# Patient Record
Sex: Male | Born: 1980
Health system: Southern US, Community
[De-identification: ages and names within clinical notes are randomized; demographics above are authoritative.]

## PROBLEM LIST (undated history)

## (undated) DIAGNOSIS — K219 Gastro-esophageal reflux disease without esophagitis: Secondary | ICD-10-CM

## (undated) DIAGNOSIS — N200 Calculus of kidney: Secondary | ICD-10-CM

## (undated) DIAGNOSIS — I10 Essential (primary) hypertension: Secondary | ICD-10-CM

## (undated) HISTORY — PX: WISDOM TOOTH EXTRACTION: SHX21

## (undated) HISTORY — DX: Calculus of kidney: N20.0

---

## 2001-02-24 ENCOUNTER — Emergency Department (HOSPITAL_COMMUNITY): Admission: EM | Admit: 2001-02-24 | Discharge: 2001-02-24 | Payer: Self-pay | Admitting: Emergency Medicine

## 2016-08-06 ENCOUNTER — Encounter (HOSPITAL_BASED_OUTPATIENT_CLINIC_OR_DEPARTMENT_OTHER): Payer: Self-pay | Admitting: Emergency Medicine

## 2016-08-06 ENCOUNTER — Emergency Department (HOSPITAL_BASED_OUTPATIENT_CLINIC_OR_DEPARTMENT_OTHER)
Admission: EM | Admit: 2016-08-06 | Discharge: 2016-08-07 | Disposition: A | Discharge status: Home / Self Care | Payer: Worker's Compensation | Attending: Emergency Medicine | Admitting: Emergency Medicine

## 2016-08-06 DIAGNOSIS — I10 Essential (primary) hypertension: Secondary | ICD-10-CM | POA: Diagnosis not present

## 2016-08-06 DIAGNOSIS — Y990 Civilian activity done for income or pay: Secondary | ICD-10-CM | POA: Insufficient documentation

## 2016-08-06 DIAGNOSIS — Z79899 Other long term (current) drug therapy: Secondary | ICD-10-CM | POA: Diagnosis not present

## 2016-08-06 DIAGNOSIS — S29011A Strain of muscle and tendon of front wall of thorax, initial encounter: Secondary | ICD-10-CM | POA: Diagnosis not present

## 2016-08-06 DIAGNOSIS — J45909 Unspecified asthma, uncomplicated: Secondary | ICD-10-CM | POA: Insufficient documentation

## 2016-08-06 DIAGNOSIS — Y929 Unspecified place or not applicable: Secondary | ICD-10-CM | POA: Insufficient documentation

## 2016-08-06 DIAGNOSIS — Y9389 Activity, other specified: Secondary | ICD-10-CM | POA: Insufficient documentation

## 2016-08-06 DIAGNOSIS — S299XXA Unspecified injury of thorax, initial encounter: Secondary | ICD-10-CM | POA: Diagnosis present

## 2016-08-06 DIAGNOSIS — X501XXA Overexertion from prolonged static or awkward postures, initial encounter: Secondary | ICD-10-CM | POA: Insufficient documentation

## 2016-08-06 HISTORY — DX: Essential (primary) hypertension: I10

## 2016-08-06 HISTORY — DX: Gastro-esophageal reflux disease without esophagitis: K21.9

## 2016-08-06 NOTE — ED Triage Notes (Signed)
Patient states that he works for a [employer] home and was picking up a body off the floor yesterday. The patient reports that he had [pain to his chest and neck yesterday with movement. Today he reports some noted pressure to his chest

## 2016-08-06 NOTE — ED Notes (Signed)
ED Provider at bedside. 

## 2016-08-06 NOTE — ED Provider Notes (Signed)
[facility] Provider Note   CSN: [id number] Arrival date & time: 08/06/16  2325   By signing my name below, I, Talbert Nan, attest that this documentation has been prepared under the direction and in the presence of Melburn Hake, [place]. Electronically Signed: Talbert Nan, Scribe. 08/06/16. 11:49 PM.    History   Chief Complaint Chief Complaint  Patient presents with  . Chest Injury    HPI Sean Crosby is a 36 y.o. male with h/o asthma, allergies, GERD, who presents to the Emergency Department complaining of CP. Pt reports having "charlie horse type pain", pulling, waxing and waning to his left breast/chest wall s/p picking up 300+ lb body from the floor yesterday at work. Denies radiation. He notes the pain worsened tonight around 10 pm, denies any new injury or fall. The pain is exacerbated when he looks/turnrs to the left. Pt states that the pain started to feel more like a pressure tonight, described as an uncomfortableness, worse with movement or when taking a deep breath. Pt has never had pain like this before. Pt has no h/o MI. Pt doesn't have any h/o cardiac problem. Pt is not supposed to have NSAIDs due to allergic reaction. Pt does not smoke or do drugs. Pt works at a funeral home. Pt denies fever, dizziness, cough, SOB, wheezing, heart palpitation, abdominal pain, nausea, diarrhea, vomiting, diaphoresis, numbness, weakness, leg swelling. Pt denies leg swelling, long distance travel, recent surgery, hx of hormone use, or hx of cancer.    The history is provided by the patient. No language interpreter was used.    Past Medical History:  Diagnosis Date  . GERD (gastroesophageal reflux disease)   . Hypertension    no longer on BP meds     There are no active problems to display for this patient.   History reviewed. No pertinent surgical history.     Home Medications    Prior to Admission medications   Medication Sig Start Date End Date Taking? Authorizing  Provider  cetirizine (ZYRTEC) 10 MG tablet Take 10 mg by mouth daily.   Yes [provider]  omeprazole (PRILOSEC) 40 MG capsule Take 40 mg by mouth daily.   Yes [provider]    Family History History reviewed. No pertinent family history.  Social History Social History  Substance Use Topics  . Smoking status: Never Smoker  . Smokeless tobacco: Never Used  . Alcohol use 1.2 oz/week    2 Glasses of wine per week     Allergies   Patient has no known allergies.   Review of Systems Review of Systems  Cardiovascular: Positive for chest pain.  All other systems reviewed and are negative.    Physical Exam Updated Vital Signs BP (!) 153/94 (BP Location: Left Arm)   Pulse 90   Temp 98.3 F (36.8 C) (Oral)   Resp 18   Ht 5\' 8"  (1.727 m)   Wt 95.3 kg   SpO2 97%   BMI 31.93 kg/m   Physical Exam  Constitutional: He is oriented to person, place, and time. He appears well-developed and well-nourished. No distress.  HENT:  Head: Normocephalic and atraumatic.  Eyes: Conjunctivae and EOM are normal. Right eye exhibits no discharge. Left eye exhibits no discharge. No scleral icterus.  Neck: Normal range of motion. Neck supple.  Cardiovascular: Normal rate, regular rhythm, normal heart sounds and intact distal pulses.   Pulmonary/Chest: Effort normal and breath sounds normal. No respiratory distress. He has no wheezes. He  has no rales. He exhibits tenderness. He exhibits no laceration, no crepitus, no edema, no deformity, no swelling and no retraction.    Mild tenderness over left anterior medial inferior chest w/o swelling erythema warmth.   Abdominal: Soft. Bowel sounds are normal. He exhibits no distension and no mass. There is no tenderness. There is no rebound and no guarding. No hernia.  Musculoskeletal: Normal range of motion. He exhibits no edema, tenderness or deformity.  FROM of BUE and BLE with 5/5 strength.   Neurological: He is alert and oriented  to person, place, and time.  Skin: Skin is warm and dry. He is not diaphoretic.  Psychiatric: He has a normal mood and affect.  Nursing note and vitals reviewed.    ED Treatments / Results   DIAGNOSTIC STUDIES: Oxygen Saturation is 97% on RA, normal by my interpretation.    COORDINATION OF CARE: 11:48 PM Discussed treatment plan with pt at bedside and pt agreed to plan, which includes EKG.    Labs (all labs ordered are listed, but only abnormal results are displayed) Labs Reviewed - No data to display  EKG  EKG Interpretation  Date/Time:  Thursday Aug 06 2016 23:35:33 EDT Ventricular Rate:  84 PR Interval:  154 QRS Duration: 76 QT Interval:  346 QTC Calculation: 408 R Axis:   65 Text Interpretation:  Normal sinus rhythm with sinus arrhythmia Confirmed by Nicanor AlconPalumbo, April ([id number]) on 08/06/2016 11:40:48 PM       Radiology No results found.  Procedures Procedures (including critical care time)  Medications Ordered in ED Medications - No data to display   Initial Impression / Assessment and Plan / ED Course  I have reviewed the triage vital signs and the nursing notes.  Pertinent labs & imaging results that were available during my care of the patient were reviewed by me and considered in my medical decision making (see chart for details).    Pt presents with left anterior chest wall pain that started yesterday while he was picking up a dead body on the floor at work ([employer]eral home). Pain worse with movement. Denies fever, SOB, cough, palpitations. Denies hx of CAD. VSS. Exam showed mild TTP over left anterior chest wall, no visible injuries. EKG showed sinus rhythm with no acute ischemic changes. CXR pending.  Hand-off to Dr. Nicanor AlconPalumbo.    Final Clinical Impressions(s) / ED Diagnoses   Final diagnoses:  None    New Prescriptions New Prescriptions   No medications on file   I personally performed the services described in this documentation, which was scribed  in my presence. The recorded information has been reviewed and is accurate.     Barrett Henleadeau, Marci Polito Elizabeth, PA-C 08/07/16 [phone]    Nicanor AlconPalumbo, April, MD 08/07/16 [phone]

## 2016-08-07 ENCOUNTER — Encounter (HOSPITAL_BASED_OUTPATIENT_CLINIC_OR_DEPARTMENT_OTHER): Payer: Self-pay | Admitting: Emergency Medicine

## 2016-08-07 ENCOUNTER — Emergency Department (HOSPITAL_BASED_OUTPATIENT_CLINIC_OR_DEPARTMENT_OTHER): Payer: Worker's Compensation

## 2016-08-07 LAB — CBC WITH DIFFERENTIAL/PLATELET
BASOS ABS: 0 10*3/uL (ref 0.0–0.1)
BASOS PCT: 0 %
EOS ABS: 0.2 10*3/uL (ref 0.0–0.7)
Eosinophils Relative: 3 %
HCT: 46.2 % (ref 39.0–52.0)
HEMOGLOBIN: 16.5 g/dL (ref 13.0–17.0)
Lymphocytes Relative: 42 %
Lymphs Abs: 3.4 10*3/uL (ref 0.7–4.0)
MCH: 30.3 pg (ref 26.0–34.0)
MCHC: 35.7 g/dL (ref 30.0–36.0)
MCV: 84.8 fL (ref 78.0–100.0)
MONO ABS: 0.7 10*3/uL (ref 0.1–1.0)
MONOS PCT: 8 %
NEUTROS PCT: 47 %
Neutro Abs: 3.7 10*3/uL (ref 1.7–7.7)
Platelets: 211 10*3/uL (ref 150–400)
RBC: 5.45 MIL/uL (ref 4.22–5.81)
RDW: 12.4 % (ref 11.5–15.5)
WBC: 8 10*3/uL (ref 4.0–10.5)

## 2016-08-07 LAB — BASIC METABOLIC PANEL
ANION GAP: 7 (ref 5–15)
BUN: 24 mg/dL — ABNORMAL HIGH (ref 6–20)
CALCIUM: 8.6 mg/dL — AB (ref 8.9–10.3)
CO2: 28 mmol/L (ref 22–32)
Chloride: 102 mmol/L (ref 101–111)
Creatinine, Ser: 0.99 mg/dL (ref 0.61–1.24)
GLUCOSE: 111 mg/dL — AB (ref 65–99)
POTASSIUM: 3.6 mmol/L (ref 3.5–5.1)
Sodium: 137 mmol/L (ref 135–145)

## 2016-08-07 LAB — TROPONIN I: Troponin I: <0.03 ng/mL (ref <0.03)

## 2016-08-07 LAB — D-DIMER, QUANTITATIVE (NOT AT ARMC)

## 2016-08-07 MED ORDER — KETOROLAC TROMETHAMINE 30 MG/ML IJ SOLN
30.0000 mg | Freq: Once | INTRAMUSCULAR | Status: DC
  Filled 2016-08-07: qty 1

## 2017-05-05 DIAGNOSIS — Z20828 Contact with and (suspected) exposure to other viral communicable diseases: Secondary | ICD-10-CM | POA: Diagnosis not present

## 2017-07-21 DIAGNOSIS — J029 Acute pharyngitis, unspecified: Secondary | ICD-10-CM | POA: Diagnosis not present

## 2017-07-21 DIAGNOSIS — Z6830 Body mass index (BMI) 30.0-30.9, adult: Secondary | ICD-10-CM | POA: Diagnosis not present

## 2017-07-21 DIAGNOSIS — W57XXXA Bitten or stung by nonvenomous insect and other nonvenomous arthropods, initial encounter: Secondary | ICD-10-CM | POA: Diagnosis not present

## 2017-08-29 DIAGNOSIS — J019 Acute sinusitis, unspecified: Secondary | ICD-10-CM | POA: Diagnosis not present

## 2017-10-08 DIAGNOSIS — F988 Other specified behavioral and emotional disorders with onset usually occurring in childhood and adolescence: Secondary | ICD-10-CM | POA: Diagnosis not present

## 2017-10-08 DIAGNOSIS — R413 Other amnesia: Secondary | ICD-10-CM | POA: Diagnosis not present

## 2017-10-08 DIAGNOSIS — E785 Hyperlipidemia, unspecified: Secondary | ICD-10-CM | POA: Diagnosis not present

## 2017-10-08 DIAGNOSIS — I10 Essential (primary) hypertension: Secondary | ICD-10-CM | POA: Diagnosis not present

## 2017-10-08 DIAGNOSIS — Z79899 Other long term (current) drug therapy: Secondary | ICD-10-CM | POA: Diagnosis not present

## 2017-10-08 DIAGNOSIS — Z6830 Body mass index (BMI) 30.0-30.9, adult: Secondary | ICD-10-CM | POA: Diagnosis not present

## 2017-12-31 DIAGNOSIS — J019 Acute sinusitis, unspecified: Secondary | ICD-10-CM | POA: Diagnosis not present

## 2017-12-31 DIAGNOSIS — Z6830 Body mass index (BMI) 30.0-30.9, adult: Secondary | ICD-10-CM | POA: Diagnosis not present

## 2017-12-31 DIAGNOSIS — J302 Other seasonal allergic rhinitis: Secondary | ICD-10-CM | POA: Diagnosis not present

## 2017-12-31 DIAGNOSIS — B9689 Other specified bacterial agents as the cause of diseases classified elsewhere: Secondary | ICD-10-CM | POA: Diagnosis not present

## 2018-02-25 ENCOUNTER — Other Ambulatory Visit: Payer: Self-pay

## 2018-02-25 ENCOUNTER — Emergency Department
Admission: EM | Admit: 2018-02-25 | Discharge: 2018-02-25 | Disposition: A | Discharge status: Home / Self Care | Payer: BLUE CROSS/BLUE SHIELD | Attending: Emergency Medicine | Admitting: Emergency Medicine

## 2018-02-25 DIAGNOSIS — R109 Unspecified abdominal pain: Secondary | ICD-10-CM | POA: Insufficient documentation

## 2018-02-25 DIAGNOSIS — A059 Bacterial foodborne intoxication, unspecified: Secondary | ICD-10-CM | POA: Diagnosis not present

## 2018-02-25 DIAGNOSIS — E860 Dehydration: Secondary | ICD-10-CM | POA: Diagnosis not present

## 2018-02-25 DIAGNOSIS — Z79899 Other long term (current) drug therapy: Secondary | ICD-10-CM | POA: Insufficient documentation

## 2018-02-25 DIAGNOSIS — R112 Nausea with vomiting, unspecified: Secondary | ICD-10-CM | POA: Diagnosis not present

## 2018-02-25 LAB — CBC
HEMATOCRIT: 50.5 % (ref 39.0–52.0)
HEMOGLOBIN: 17.5 g/dL — AB (ref 13.0–17.0)
MCH: 30.3 pg (ref 26.0–34.0)
MCHC: 34.7 g/dL (ref 30.0–36.0)
MCV: 87.4 fL (ref 80.0–100.0)
NRBC: 0 % (ref 0.0–0.2)
Platelets: 176 10*3/uL (ref 150–400)
RBC: 5.78 MIL/uL (ref 4.22–5.81)
RDW: 11.9 % (ref 11.5–15.5)
WBC: 9.2 10*3/uL (ref 4.0–10.5)

## 2018-02-25 LAB — COMPREHENSIVE METABOLIC PANEL
ALT: 40 U/L (ref 0–44)
AST: 27 U/L (ref 15–41)
Albumin: 4.2 g/dL (ref 3.5–5.0)
Alkaline Phosphatase: 55 U/L (ref 38–126)
Anion gap: 8 (ref 5–15)
BUN: 26 mg/dL — ABNORMAL HIGH (ref 6–20)
CHLORIDE: 103 mmol/L (ref 98–111)
CO2: 30 mmol/L (ref 22–32)
Calcium: 8.7 mg/dL — ABNORMAL LOW (ref 8.9–10.3)
Creatinine, Ser: 1.15 mg/dL (ref 0.61–1.24)
GFR calc Af Amer: >60 mL/min (ref >60)
Glucose, Bld: 156 mg/dL — ABNORMAL HIGH (ref 70–99)
POTASSIUM: 3.9 mmol/L (ref 3.5–5.1)
SODIUM: 141 mmol/L (ref 135–145)
Total Bilirubin: 1.1 mg/dL (ref 0.3–1.2)
Total Protein: 6.9 g/dL (ref 6.5–8.1)

## 2018-02-25 LAB — LIPASE, BLOOD: LIPASE: 34 U/L (ref 11–51)

## 2018-02-25 MED ORDER — ONDANSETRON 4 MG PO TBDP: 4.0000 mg | ORAL_TABLET | Freq: Three times a day (TID) | ORAL | 0 refills | Status: DC | PRN

## 2018-02-25 MED ORDER — HYOSCYAMINE SULFATE 0.125 MG PO TBDP
0.2500 mg | ORAL_TABLET | Freq: Once | ORAL | Status: AC
  Administered 2018-02-25: 0.25 mg via ORAL
  Filled 2018-02-25: qty 2

## 2018-02-25 MED ORDER — ONDANSETRON HCL 4 MG/2ML IJ SOLN
4.0000 mg | Freq: Once | INTRAMUSCULAR | Status: AC
  Administered 2018-02-25: 4 mg via INTRAVENOUS
  Filled 2018-02-25: qty 2

## 2018-02-25 MED ORDER — SODIUM CHLORIDE 0.9 % IV BOLUS
1000.0000 mL | Freq: Once | INTRAVENOUS | Status: AC
  Administered 2018-02-25: 1000 mL via INTRAVENOUS

## 2018-02-25 MED ORDER — LOPERAMIDE HCL 2 MG PO CAPS
4.0000 mg | ORAL_CAPSULE | Freq: Once | ORAL | Status: AC
  Administered 2018-02-25: 4 mg via ORAL
  Filled 2018-02-25: qty 2

## 2018-02-25 MED ORDER — HYOSCYAMINE SULFATE SL 0.125 MG SL SUBL: 0.1250 mg | SUBLINGUAL_TABLET | Freq: Three times a day (TID) | SUBLINGUAL | 0 refills | Status: DC | PRN

## 2018-02-25 NOTE — ED Provider Notes (Signed)
[facility] Emergency Department Provider Note  ____________________________________________   First MD Initiated Contact with Patient 02/25/18 0153     (approximate)  I have reviewed the triage vital signs and the nursing notes.   HISTORY  Chief Complaint Abdominal Pain and Emesis   HPI Sean Crosby is a 38 y.o. male self presents to the emergency department with sudden onset severe nausea and vomiting that began about 15 minutes after eating Thanksgiving dinner.  He ate the same food that everyone else ate although he was the only one to eat the potato salad.  He has moderate to severe cramping diffuse abdominal pain although only when vomiting.  His pain is resolved when not vomiting.  He has had difficulty keeping anything down.  He has no history of abdominal surgeries.  No testicular pain.  His symptoms were sudden onset and severe.   His pain is nonradiating.   Past Medical History:  Diagnosis Date  . GERD (gastroesophageal reflux disease)   . Hypertension    no longer on BP meds     There are no active problems to display for this patient.   History reviewed. No pertinent surgical history.  Prior to Admission medications   Medication Sig Start Date End Date Taking? Authorizing Provider  cetirizine (ZYRTEC) 10 MG tablet Take 10 mg by mouth daily.    [provider]  Hyoscyamine Sulfate SL (LEVSIN/SL) 0.125 MG SUBL Place 0.125 mg under the tongue every 8 (eight) hours as needed (abdominal pain). 02/25/18   Merrily Brittle, MD  omeprazole (PRILOSEC) 40 MG capsule Take 40 mg by mouth daily.    [provider]  ondansetron (ZOFRAN ODT) 4 MG disintegrating tablet Take 1 tablet (4 mg total) by mouth every 8 (eight) hours as needed for nausea or vomiting. 02/25/18   Merrily Brittle, MD    Allergies Nsaids  No family history on file.  Social History Social History   Tobacco Use  . Smoking status: Never Smoker  . Smokeless  tobacco: Never Used  Substance Use Topics  . Alcohol use: Yes    Alcohol/week: 2.0 standard drinks    Types: 2 Glasses of wine per week  . Drug use: No    Review of Systems Constitutional: No fever/chills Eyes: No visual changes. ENT: No sore throat. Cardiovascular: Denies chest pain. Respiratory: Denies shortness of breath. Gastrointestinal: Positive for abdominal pain.  Positive for nausea, positive for vomiting.  Positive for diarrhea.  No constipation. Genitourinary: Negative for dysuria. Musculoskeletal: Negative for back pain. Skin: Negative for rash. Neurological: Negative for headaches, focal weakness or numbness.   ____________________________________________   PHYSICAL EXAM:  VITAL SIGNS: ED Triage Vitals  Enc Vitals Group     BP 02/25/18 0138 (!) 148/87     Pulse Rate 02/25/18 0138 (!) 134     Resp 02/25/18 0138 17     Temp --      Temp src --      SpO2 02/25/18 0138 95 %     Weight 02/25/18 0137 200 lb (90.7 kg)     Height 02/25/18 0137 5\' 7"  (1.702 m)     Head Circumference --      Peak Flow --      Pain Score 02/25/18 0137 0     Pain Loc --      Pain Edu? --      Excl. in GC? --     Constitutional: Alert and oriented x4 tearful holding his abdomen and  very uncomfortable appearing Eyes: PERRL EOMI. Head: Atraumatic. Nose: No congestion/rhinnorhea. Mouth/Throat: No trismus Neck: No stridor.   Cardiovascular: Tachycardic rate, regular rhythm. Grossly normal heart sounds.  Good peripheral circulation. Respiratory: Increased respiratory effort.  No retractions. Lungs CTAB and moving good air Gastrointestinal: Soft somewhat distended and diffusely tender although no rebound or guarding no peritonitis.  Hyperactive bowel sounds Musculoskeletal: No lower extremity edema   Neurologic:  Normal speech and language. No gross focal neurologic deficits are appreciated. Skin:  Skin is warm, dry and intact. No rash noted. Psychiatric: Mood and affect are normal.  Speech and behavior are normal.    ____________________________________________   DIFFERENTIAL includes but not limited to  Food poisoning, gastroenteritis, appendicitis, diverticulitis ____________________________________________   LABS (all labs ordered are listed, but only abnormal results are displayed)  Labs Reviewed  COMPREHENSIVE METABOLIC PANEL - Abnormal; Notable for the following components:      Result Value   Glucose, Bld 156 (*)    BUN 26 (*)    Calcium 8.7 (*)    All other components within normal limits  CBC - Abnormal; Notable for the following components:   Hemoglobin 17.5 (*)    All other components within normal limits  LIPASE, BLOOD    Work reviewed by me shows significant hemoconcentration consistent with dehydration __________________________________________  EKG   ____________________________________________  RADIOLOGY   ____________________________________________   PROCEDURES  Procedure(s) performed: no  Procedures  Critical Care performed: no  ____________________________________________   INITIAL IMPRESSION / ASSESSMENT AND PLAN / ED COURSE  Pertinent labs & imaging results that were available during my care of the patient were reviewed by me and considered in my medical decision making (see chart for details).   As part of my medical decision making, I reviewed the following data within the electronic MEDICAL RECORD NUMBER History obtained from family if available, nursing notes, old chart and ekg, as well as notes from prior ED visits.  The patient comes to the emergency department with sudden onset severe nausea vomiting cramping abdominal pain that began about 15 minutes after having potato salad that was left out.  Given IV fluids as he is obviously dehydrated.  Lab work is pending.  I will also treat him with loperamide for the diarrhea as well as IV ondansetron and Levsin for the cramping discomfort.     The patient's lab work  is consistent with dehydration.  Following fluids Zofran and loperamide his symptoms are significantly improved.  We discussed that this is likely a preformed toxin and he does not require antibiotics.  I will prescribe Levsin as well as Zofran for home and he understands to take over-the-counter loperamide.  Strict return precautions have been given. ____________________________________________   FINAL CLINICAL IMPRESSION(S) / ED DIAGNOSES  Final diagnoses:  Food poisoning  Dehydration      NEW MEDICATIONS STARTED DURING THIS VISIT:  Discharge Medication List as of 02/25/2018  3:07 AM    START taking these medications   Details  Hyoscyamine Sulfate SL (LEVSIN/SL) 0.125 MG SUBL Place 0.125 mg under the tongue every 8 (eight) hours as needed (abdominal pain)., Starting Fri 02/25/2018, Print    ondansetron (ZOFRAN ODT) 4 MG disintegrating tablet Take 1 tablet (4 mg total) by mouth every 8 (eight) hours as needed for nausea or vomiting., Starting Fri 02/25/2018, Print         Note:  This document was prepared using Dragon voice recognition software and may include unintentional dictation errors.  Merrily Brittleifenbark, Teya Otterson, MD 02/27/18 1047

## 2018-02-25 NOTE — ED Triage Notes (Signed)
Pt arrives to ED via POV from home with c/o N/V that started immediately after eating dinner tonight. Pt denies c/o abdominal pain, just reports "it hurts when I throw up". Pt denies use of ETOH or illicit drugs PTA.

## 2018-02-25 NOTE — Discharge Instructions (Signed)
It was a pleasure to take care of you today, and thank you for coming to our emergency department.  If you have any questions or concerns before leaving please ask the nurse to grab me and I'm more than happy to go through your aftercare instructions again.  If you have any concerns once you are home that you are not improving or are in fact getting worse before you can make it to your follow-up appointment, please do not hesitate to call [phone] and come back for further evaluation.  Merrily BrittleNeil Nthony Lefferts, MD  Results for orders placed or performed during the hospital encounter of 02/25/18  Lipase, blood  Result Value Ref Range   Lipase 34 11 - 51 U/L  Comprehensive metabolic panel  Result Value Ref Range   Sodium 141 135 - 145 mmol/L   Potassium 3.9 3.5 - 5.1 mmol/L   Chloride 103 98 - 111 mmol/L   CO2 30 22 - 32 mmol/L   Glucose, Bld 156 (H) 70 - 99 mg/dL   BUN 26 (H) 6 - 20 mg/dL   Creatinine, Ser 1.911.15 0.61 - 1.24 mg/dL   Calcium 8.7 (L) 8.9 - 10.3 mg/dL   Total Protein 6.9 6.5 - 8.1 g/dL   Albumin 4.2 3.5 - 5.0 g/dL   AST 27 15 - 41 U/L   ALT 40 0 - 44 U/L   Alkaline Phosphatase 55 38 - 126 U/L   Total Bilirubin 1.1 0.3 - 1.2 mg/dL   GFR calc non Af Amer >60 >60 mL/min   GFR calc Af Amer >60 >60 mL/min   Anion gap 8 5 - 15  CBC  Result Value Ref Range   WBC 9.2 4.0 - 10.5 K/uL   RBC 5.78 4.22 - 5.81 MIL/uL   Hemoglobin 17.5 (H) 13.0 - 17.0 g/dL   HCT 47.850.5 29.539.0 - 62.152.0 %   MCV 87.4 80.0 - 100.0 fL   MCH 30.3 26.0 - 34.0 pg   MCHC 34.7 30.0 - 36.0 g/dL   RDW 30.811.9 65.711.5 - 84.615.5 %   Platelets 176 150 - 400 K/uL   nRBC 0.0 0.0 - 0.2 %

## 2018-03-29 IMAGING — DX DG CHEST 2V
2 series · 2 of 2 positions shown · non-contrast
Comparison: None.

CLINICAL DATA: Left chest wall pain, onset yesterday.

EXAM:
CHEST  2 VIEW

[chest pa]
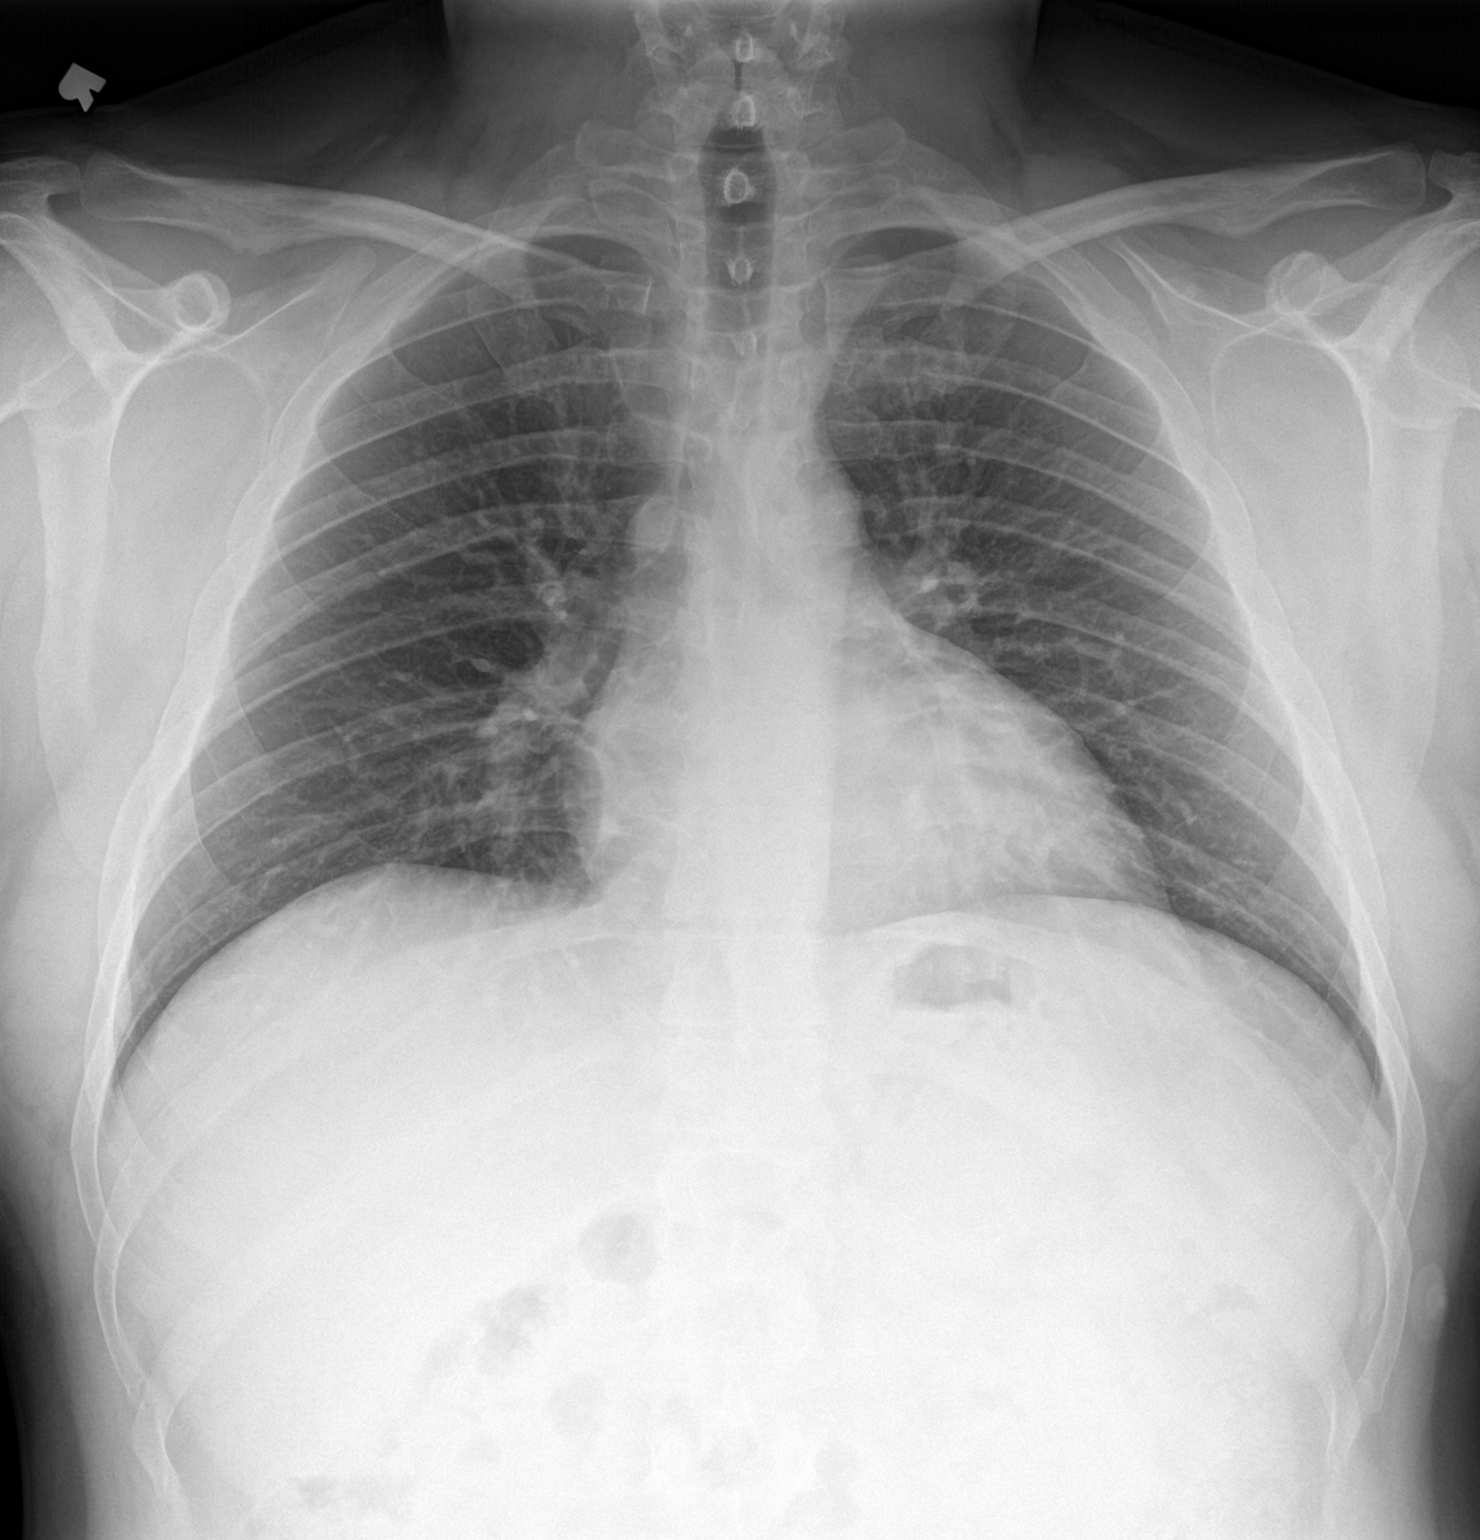

[chest lat]
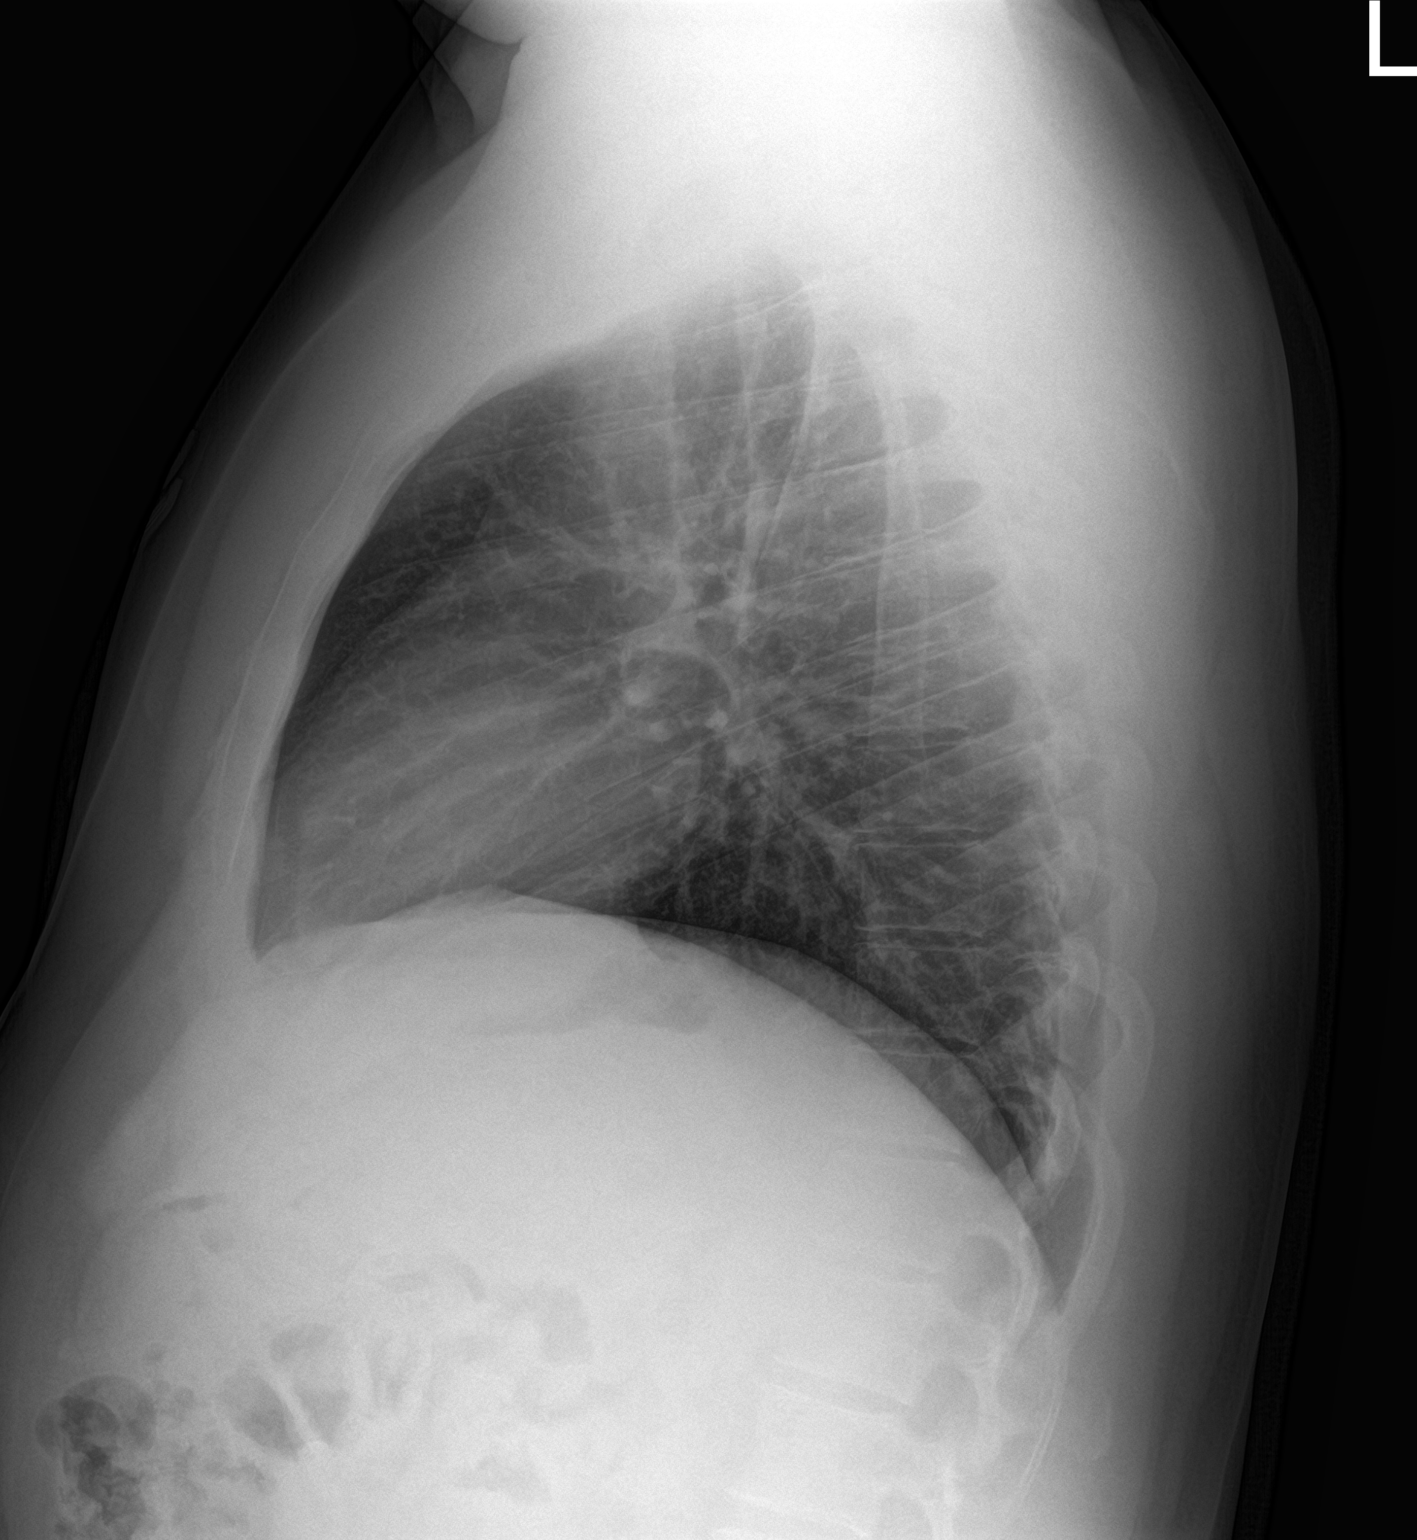

[2 of 2 positions shown; findings below may reference images not displayed]

FINDINGS: The cardiomediastinal contours are normal. The lungs are clear.
Pulmonary vasculature is normal. No consolidation, pleural effusion,
or pneumothorax. No acute osseous abnormalities are seen.
IMPRESSION: No active cardiopulmonary disease.

## 2018-09-06 DIAGNOSIS — R61 Generalized hyperhidrosis: Secondary | ICD-10-CM | POA: Diagnosis not present

## 2018-09-06 DIAGNOSIS — I959 Hypotension, unspecified: Secondary | ICD-10-CM | POA: Diagnosis not present

## 2018-09-06 DIAGNOSIS — Z6830 Body mass index (BMI) 30.0-30.9, adult: Secondary | ICD-10-CM | POA: Diagnosis not present

## 2018-09-06 DIAGNOSIS — X58XXXA Exposure to other specified factors, initial encounter: Secondary | ICD-10-CM | POA: Diagnosis not present

## 2018-09-06 DIAGNOSIS — R0902 Hypoxemia: Secondary | ICD-10-CM | POA: Diagnosis not present

## 2018-09-06 DIAGNOSIS — I10 Essential (primary) hypertension: Secondary | ICD-10-CM | POA: Diagnosis not present

## 2018-09-06 DIAGNOSIS — R000 Tachycardia, unspecified: Secondary | ICD-10-CM | POA: Diagnosis not present

## 2018-09-06 DIAGNOSIS — T7840XA Allergy, unspecified, initial encounter: Secondary | ICD-10-CM | POA: Diagnosis not present

## 2018-10-12 DIAGNOSIS — Z79899 Other long term (current) drug therapy: Secondary | ICD-10-CM | POA: Diagnosis not present

## 2018-10-12 DIAGNOSIS — I10 Essential (primary) hypertension: Secondary | ICD-10-CM | POA: Diagnosis not present

## 2018-10-12 DIAGNOSIS — R5383 Other fatigue: Secondary | ICD-10-CM | POA: Diagnosis not present

## 2018-10-12 DIAGNOSIS — Z6831 Body mass index (BMI) 31.0-31.9, adult: Secondary | ICD-10-CM | POA: Diagnosis not present

## 2018-10-12 DIAGNOSIS — K5900 Constipation, unspecified: Secondary | ICD-10-CM | POA: Diagnosis not present

## 2018-10-12 DIAGNOSIS — R109 Unspecified abdominal pain: Secondary | ICD-10-CM | POA: Diagnosis not present

## 2018-10-12 DIAGNOSIS — E785 Hyperlipidemia, unspecified: Secondary | ICD-10-CM | POA: Diagnosis not present

## 2019-04-30 DIAGNOSIS — K219 Gastro-esophageal reflux disease without esophagitis: Secondary | ICD-10-CM | POA: Diagnosis not present

## 2019-04-30 DIAGNOSIS — N200 Calculus of kidney: Secondary | ICD-10-CM | POA: Diagnosis not present

## 2019-04-30 DIAGNOSIS — Z6831 Body mass index (BMI) 31.0-31.9, adult: Secondary | ICD-10-CM | POA: Diagnosis not present

## 2019-04-30 DIAGNOSIS — R109 Unspecified abdominal pain: Secondary | ICD-10-CM | POA: Diagnosis not present

## 2019-04-30 DIAGNOSIS — Z79899 Other long term (current) drug therapy: Secondary | ICD-10-CM | POA: Diagnosis not present

## 2019-04-30 DIAGNOSIS — R1031 Right lower quadrant pain: Secondary | ICD-10-CM | POA: Diagnosis not present

## 2019-04-30 DIAGNOSIS — I10 Essential (primary) hypertension: Secondary | ICD-10-CM | POA: Diagnosis not present

## 2019-04-30 DIAGNOSIS — N132 Hydronephrosis with renal and ureteral calculous obstruction: Secondary | ICD-10-CM | POA: Diagnosis not present

## 2019-04-30 DIAGNOSIS — R112 Nausea with vomiting, unspecified: Secondary | ICD-10-CM | POA: Diagnosis not present

## 2019-05-04 ENCOUNTER — Emergency Department: Payer: BC Managed Care – PPO

## 2019-05-04 ENCOUNTER — Other Ambulatory Visit: Payer: Self-pay

## 2019-05-04 ENCOUNTER — Emergency Department
Admission: EM | Admit: 2019-05-04 | Discharge: 2019-05-05 | Disposition: A | Discharge status: Home / Self Care | Payer: BC Managed Care – PPO | Attending: Emergency Medicine | Admitting: Emergency Medicine

## 2019-05-04 DIAGNOSIS — N200 Calculus of kidney: Secondary | ICD-10-CM | POA: Diagnosis not present

## 2019-05-04 DIAGNOSIS — R232 Flushing: Secondary | ICD-10-CM | POA: Insufficient documentation

## 2019-05-04 DIAGNOSIS — R112 Nausea with vomiting, unspecified: Secondary | ICD-10-CM | POA: Diagnosis not present

## 2019-05-04 DIAGNOSIS — I10 Essential (primary) hypertension: Secondary | ICD-10-CM | POA: Insufficient documentation

## 2019-05-04 DIAGNOSIS — T7840XA Allergy, unspecified, initial encounter: Secondary | ICD-10-CM | POA: Diagnosis not present

## 2019-05-04 DIAGNOSIS — N132 Hydronephrosis with renal and ureteral calculous obstruction: Secondary | ICD-10-CM | POA: Diagnosis not present

## 2019-05-04 DIAGNOSIS — Z79899 Other long term (current) drug therapy: Secondary | ICD-10-CM | POA: Insufficient documentation

## 2019-05-04 DIAGNOSIS — R000 Tachycardia, unspecified: Secondary | ICD-10-CM | POA: Diagnosis not present

## 2019-05-04 LAB — COMPREHENSIVE METABOLIC PANEL
ALT: 34 U/L (ref 0–44)
AST: 31 U/L (ref 15–41)
Albumin: 3.9 g/dL (ref 3.5–5.0)
Alkaline Phosphatase: 69 U/L (ref 38–126)
Anion gap: 12 (ref 5–15)
BUN: 25 mg/dL — ABNORMAL HIGH (ref 6–20)
CO2: 24 mmol/L (ref 22–32)
Calcium: 8.9 mg/dL (ref 8.9–10.3)
Chloride: 102 mmol/L (ref 98–111)
Creatinine, Ser: 1.52 mg/dL — ABNORMAL HIGH (ref 0.61–1.24)
GFR calc Af Amer: >60 mL/min (ref >60)
GFR calc non Af Amer: 56 mL/min — ABNORMAL LOW (ref >60)
Glucose, Bld: 151 mg/dL — ABNORMAL HIGH (ref 70–99)
Potassium: 3.7 mmol/L (ref 3.5–5.1)
Sodium: 138 mmol/L (ref 135–145)
Total Bilirubin: 0.9 mg/dL (ref 0.3–1.2)
Total Protein: 6.8 g/dL (ref 6.5–8.1)

## 2019-05-04 LAB — CBC
HCT: 43.6 % (ref 39.0–52.0)
Hemoglobin: 15.1 g/dL (ref 13.0–17.0)
MCH: 30 pg (ref 26.0–34.0)
MCHC: 34.6 g/dL (ref 30.0–36.0)
MCV: 86.5 fL (ref 80.0–100.0)
Platelets: 294 10*3/uL (ref 150–400)
RBC: 5.04 MIL/uL (ref 4.22–5.81)
RDW: 11.9 % (ref 11.5–15.5)
WBC: 10.1 10*3/uL (ref 4.0–10.5)
nRBC: 0 % (ref 0.0–0.2)

## 2019-05-04 MED ORDER — PREDNISONE 20 MG PO TABS: 40.0000 mg | ORAL_TABLET | Freq: Every day | ORAL | 0 refills | Status: DC

## 2019-05-04 MED ORDER — SODIUM CHLORIDE 0.9 % IV BOLUS
1000.0000 mL | Freq: Once | INTRAVENOUS | Status: AC
  Administered 2019-05-04: 1000 mL via INTRAVENOUS

## 2019-05-04 MED ORDER — METHYLPREDNISOLONE SODIUM SUCC 125 MG IJ SOLR
125.0000 mg | Freq: Once | INTRAMUSCULAR | Status: AC
  Administered 2019-05-04: 20:00:00 125 mg via INTRAVENOUS

## 2019-05-04 MED ORDER — EPINEPHRINE 0.3 MG/0.3ML IJ SOAJ
0.3000 mg | Freq: Once | INTRAMUSCULAR | Status: AC
  Administered 2019-05-04: 20:00:00 0.3 mg via INTRAMUSCULAR

## 2019-05-04 MED ORDER — DIPHENHYDRAMINE HCL 50 MG/ML IJ SOLN
50.0000 mg | Freq: Once | INTRAMUSCULAR | Status: AC
  Administered 2019-05-04: 20:00:00 50 mg via INTRAVENOUS

## 2019-05-04 MED ORDER — FAMOTIDINE IN NACL 20-0.9 MG/50ML-% IV SOLN
20.0000 mg | Freq: Once | INTRAVENOUS | Status: AC
  Administered 2019-05-04: 20 mg via INTRAVENOUS
  Filled 2019-05-04: qty 50

## 2019-05-04 MED ORDER — EPINEPHRINE 0.3 MG/0.3ML IJ SOAJ: 0.3000 mg | INTRAMUSCULAR | 1 refills | Status: AC | PRN

## 2019-05-04 NOTE — ED Triage Notes (Signed)
Patient coming in for allergic reaction. Patient with 1 large emesis and syncopal episode. Patient c/o SOB and malaise. Patient's wife tried unsuccessfully to administer an epipen.

## 2019-05-04 NOTE — ED Provider Notes (Signed)
[facility] Emergency Department Provider Note  Time seen: 7:54 PM  I have reviewed the triage vital signs and the nursing notes.   HISTORY  Chief Complaint Allergic reaction  HPI Sean Crosby is a 39 y.o. male with a past medical history of hypertension, gastric reflux, presents to the emergency department for a possible allergic reaction.  Patient states he was eating a bowl of soup when he became very flushed, felt like he was going to pass out and vomited.  Patient says upon arrival here he continued to feel like he was going to pass out, had a near syncopal episode became diaphoretic and vomited.  Patient states this is the exact same thing that has happened to him 2 times previously due to an allergic reaction to a food that he ate but he was never able to pin down what the allergic reaction was to.   Upon arrival to the emergency department patient is diaphoretic as vomitus in his beard insured, states he feels like he is going to pass out.  Patient does have mild flushing of the skin but no obvious hives.  States mild shortness of breath.  Past Medical History:  Diagnosis Date  . GERD (gastroesophageal reflux disease)   . Hypertension    no longer on BP meds     There are no problems to display for this patient.   No past surgical history on file.  Prior to Admission medications   Medication Sig Start Date End Date Taking? Authorizing Provider  cetirizine (ZYRTEC) 10 MG tablet Take 10 mg by mouth daily.    [provider]  Hyoscyamine Sulfate SL (LEVSIN/SL) 0.125 MG SUBL Place 0.125 mg under the tongue every 8 (eight) hours as needed (abdominal pain). 02/25/18   Merrily Brittle, MD  omeprazole (PRILOSEC) 40 MG capsule Take 40 mg by mouth daily.    [provider]  ondansetron (ZOFRAN ODT) 4 MG disintegrating tablet Take 1 tablet (4 mg total) by mouth every 8 (eight) hours as needed for nausea or vomiting. 02/25/18   Merrily Brittle, MD     Allergies  Allergen Reactions  . Nsaids Anaphylaxis    No family history on file.  Social History Social History   Tobacco Use  . Smoking status: Never Smoker  . Smokeless tobacco: Never Used  Substance Use Topics  . Alcohol use: Yes    Alcohol/week: 2.0 standard drinks    Types: 2 Glasses of wine per week  . Drug use: No    Review of Systems Constitutional: Negative for fever. Cardiovascular: Negative for chest pain. Respiratory: Positive for mild shortness of breath. Gastrointestinal: Negative for abdominal pain.  Positive for nausea vomiting. Musculoskeletal: Negative for musculoskeletal complaints Skin: Flushing/redness of the skin. Neurological: Negative for headache All other ROS negative  ____________________________________________   PHYSICAL EXAM:  Constitutional: Alert and oriented. Well appearing and in no distress. Eyes: Normal exam ENT      Head: Normocephalic and atraumatic.      Mouth/Throat: Mucous membranes are moist. Cardiovascular: Normal rate, regular rhythm. Respiratory: Normal respiratory effort without tachypnea nor retractions. Breath sounds are clear  Gastrointestinal: Soft and nontender. No distention.  Musculoskeletal: Nontender with normal range of motion in all extremities. Neurologic:  Normal speech and language. No gross focal neurologic deficits  Skin:  Skin is warm, dry and intact.  Psychiatric: Mood and affect are normal.   ____________________________________________    EKG  EKG viewed and interpreted by myself shows sinus tachycardia 111  bpm with a narrow QRS, normal axis, normal intervals, nonspecific ST changes.  ____________________________________________    [id number]  CT pending  ____________________________________________   INITIAL IMPRESSION / ASSESSMENT AND PLAN / ED COURSE  Pertinent labs & imaging results that were available during my care of the patient were reviewed by me and considered in my  medical decision making (see chart for details).   Patient presents to the emergency department for nausea vomiting flushing feeling like he is going to pass out as well as erythema of the skin.  Patient states he was eating soup when the symptoms occurred, history of prior allergic reactions with similar symptoms.  Given his feeling of shortness of breath with vomiting we will treat for anaphylactic reaction.  We will dose IM epinephrine, IV Benadryl Solu-Medrol Pepcid and IV fluids.  We will continue to closely monitor the patient we will check basic labs as well.  Patient is doing much better.  His lab work does show a creatinine of 1.5 given his recent diagnosis of a kidney stone I offered to repeat a CT scan, patient strongly wishes to repeat a CT to see if the stone has progressed at all.  Patient states he has not had any further kidney stone pain for the past 2 days.  States he was told it was a 4 mm stone.  We will arrange for urology follow-up regardless for the patient.  Patient's blood pressure has normalized, denies any shortness of breath denies any itching denies any nausea.  We will discharge with prednisone for the next 5 days.  CT pending, patient care signed out to Dr. Karma Greaser.  Sean Crosby was evaluated in Emergency Department on 05/04/2019 for the symptoms described in the history of present illness. He was evaluated in the context of the global COVID-19 pandemic, which necessitated consideration that the patient might be at risk for infection with the SARS-CoV-2 virus that causes COVID-19. Institutional protocols and algorithms that pertain to the evaluation of patients at risk for COVID-19 are in a state of rapid change based on information released by regulatory bodies including the CDC and federal and state organizations. These policies and algorithms were followed during the patient's care in the ED.  ____________________________________________   FINAL CLINICAL IMPRESSION(S) /  ED DIAGNOSES  Allergic reaction Nausea vomiting   Harvest Dark, MD 05/09/19 [phone]

## 2019-05-04 NOTE — ED Provider Notes (Signed)
-----------------------------------------   11:22 PM on 05/04/2019 -----------------------------------------  Assuming care from Dr. Lenard Lance.  In short, Sean Crosby is a 39 y.o. male with a chief complaint of anaphylaxis.  Refer to the original H&P for additional details.  The current plan of care is to follow up labs including urinalysis and CT scan.    ----------------------------------------- 11:40 PM on 05/04/2019 -----------------------------------------  CT scan shows interval progression of right-sided ureteral stone from the proximal ureter to the distal ureter, about 50 mm from the bladder.  No other acute findings.  Urinalysis is pending.   ----------------------------------------- 12:54 AM on 05/05/2019 -----------------------------------------  Patient feels well and has had no recurrence of his anaphylactic symptoms.  He is ambulatory without difficulty and without getting lightheaded or dizzy.  We went over his results including his urinalysis which shows no sign of infection although he does still have some blood cells and it had a few ketones which is consistent with a mild acute kidney injury likely secondary to dehydration.  He received 2 L of fluid in the ED.  He is comfortable with the plan to go home and follow-up with urology as needed.  I gave my usual customary return precautions and provided the paperwork prepared by Dr. Lenard Lance.   Loleta Rose, MD 05/05/19 [phone]

## 2019-05-05 DIAGNOSIS — Z87892 Personal history of anaphylaxis: Secondary | ICD-10-CM | POA: Diagnosis not present

## 2019-05-05 DIAGNOSIS — Z6832 Body mass index (BMI) 32.0-32.9, adult: Secondary | ICD-10-CM | POA: Diagnosis not present

## 2019-05-05 DIAGNOSIS — Z87442 Personal history of urinary calculi: Secondary | ICD-10-CM | POA: Diagnosis not present

## 2019-05-05 LAB — URINALYSIS, COMPLETE (UACMP) WITH MICROSCOPIC
Bacteria, UA: NONE SEEN
Bilirubin Urine: NEGATIVE
Glucose, UA: 50 mg/dL — AB
Ketones, ur: 20 mg/dL — AB
Leukocytes,Ua: NEGATIVE
Nitrite: NEGATIVE
Protein, ur: 30 mg/dL — AB
Specific Gravity, Urine: 1.02 (ref 1.005–1.030)
pH: 5 (ref 5.0–8.0)

## 2019-05-05 LAB — URINE CULTURE: Culture: <10000 — AB

## 2019-05-10 ENCOUNTER — Other Ambulatory Visit: Payer: Self-pay

## 2019-05-10 ENCOUNTER — Ambulatory Visit (INDEPENDENT_AMBULATORY_CARE_PROVIDER_SITE_OTHER): Payer: BC Managed Care – PPO | Admitting: Allergy and Immunology

## 2019-05-10 ENCOUNTER — Encounter: Payer: Self-pay | Admitting: Allergy and Immunology

## 2019-05-10 VITALS — BP 120/90 | HR 96 | Temp 97.3°F | Resp 16 | Ht 67.4 in | Wt 215.8 lb

## 2019-05-10 DIAGNOSIS — T782XXD Anaphylactic shock, unspecified, subsequent encounter: Secondary | ICD-10-CM

## 2019-05-10 DIAGNOSIS — R739 Hyperglycemia, unspecified: Secondary | ICD-10-CM | POA: Diagnosis not present

## 2019-05-10 DIAGNOSIS — J301 Allergic rhinitis due to pollen: Secondary | ICD-10-CM

## 2019-05-10 DIAGNOSIS — J3089 Other allergic rhinitis: Secondary | ICD-10-CM | POA: Diagnosis not present

## 2019-05-10 DIAGNOSIS — J4520 Mild intermittent asthma, uncomplicated: Secondary | ICD-10-CM | POA: Diagnosis not present

## 2019-05-10 DIAGNOSIS — D72820 Lymphocytosis (symptomatic): Secondary | ICD-10-CM | POA: Diagnosis not present

## 2019-05-10 NOTE — Patient Instructions (Addendum)
  1.  Allergen avoidance measures - dust mite, pollen, cat  2.  Continue nasal steroid with Nasonex, OTC Nasacort, OTC Rhinocort  3. If Needed:   A. OTC antihistamine  B. Albuterol HFA - 2 inhalations every 4-6 hours  C. Epinephrine, benadryl, MD/ER evaluation for allergic reaction  4. Blood - CBC w/D, Alpha-gal panel, Tryptase, hemoglobin A1C  5. Further evaluation? Yes, if recurrent  6. Obtain Covid vaccine  7. Contact clinic if reactions

## 2019-05-10 NOTE — Progress Notes (Signed)
[place] [facility] [place] - [place] - [place] - [place]   Dear Dr. Jeanie Sewer,  Thank you for referring Sean Crosby to the [facility] [place] on 05/10/2019.   Below is a summation of this patient's evaluation and recommendations.  Thank you for your referral. I will keep you informed about this patient's response to treatment.   If you have any questions please do not hesitate to contact me.   Sincerely,  Jessica Priest, MD Allergy / Immunology [facility]   ______________________________________________________________________    NEW PATIENT NOTE  Referring Provider: Noni Saupe, MD Primary Provider: Noni Saupe, MD Date of office visit: 05/10/2019    Subjective:   Chief Complaint:  Sean Crosby (DOB: April 07, 1980) is a 39 y.o. male who presents to the clinic on 05/10/2019 with a chief complaint of Allergic Reaction .     HPI: Sean Crosby presents to this clinic in evaluation of recurrent allergic reactions.  He has had at least 3 and possibly 4 allergic reactions over the course of the past several years manifested predominantly as flushing and red face and then shortness of breath and syncope or near syncope and with his last reaction that occurred 03 May 2018 he developed emesis and tenesmus.  It sounds as though he has been treated multiple times with epinephrine and hydration with good result.  There has never really been a common trigger that has given rise to these reactions.  They have occurred after eating different types of foods and snack bars but there has never been a common ingredient that has giving rise to this issue.  He does have a history of allergic disease with allergic rhinoconjunctivitis treated successfully with Nasonex.  He has been diagnosed with asthma as a teenager but this is a minimal issue and has not really caused him any problem and he requires a short  acting bronchodilator on a very rare occasion.  If he gets exposed to cats and hay he does develop chest and nose and eye symptoms.  Past Medical History:  Diagnosis Date  . GERD (gastroesophageal reflux disease)   . Hypertension   . Kidney stone     Past Surgical History:  Procedure Laterality Date  . WISDOM TOOTH EXTRACTION      Allergies as of 05/10/2019      Reactions   Nsaids Anaphylaxis      Medication List    amLODipine 5 MG tablet Commonly known as: NORVASC Take 5 mg by mouth daily.   cetirizine 10 MG tablet Commonly known as: ZYRTEC Take 10 mg by mouth daily.   EPINEPHrine 0.3 mg/0.3 mL Soaj injection Commonly known as: EpiPen 2-Pak Inject 0.3 mLs (0.3 mg total) into the muscle as needed for anaphylaxis.   mometasone 50 MCG/ACT nasal spray Commonly known as: NASONEX 2 sprays daily.   olmesartan 20 MG tablet Commonly known as: BENICAR Take 20 mg by mouth daily.   omeprazole 20 MG capsule Commonly known as: PRILOSEC Take 20 mg by mouth daily.    tamsulosin 0.4 MG Caps capsule Commonly known as: FLOMAX Take 0.4 mg by mouth daily.       Review of systems negative except as noted in HPI / PMHx or noted below:  Review of Systems  Constitutional: Negative.   HENT: Negative.   Eyes: Negative.   Respiratory: Negative.   Cardiovascular: Negative.   Gastrointestinal: Negative.   Genitourinary: Negative.  Musculoskeletal: Negative.   Skin: Negative.   Neurological: Negative.   Endo/Heme/Allergies: Negative.   Psychiatric/Behavioral: Negative.     Family History  Problem Relation Age of Onset  . COPD Mother   . Leukemia Maternal Grandmother     Social History   Socioeconomic History  . Marital status: Married    Spouse name: Not on file  . Number of children: Not on file  . Years of education: Not on file  . Highest education level: Not on file  Occupational History  . Not on file  Tobacco Use  . Smoking status: Never Smoker  .  Smokeless tobacco: Never Used  Substance and Sexual Activity  . Alcohol use: Yes    Alcohol/week: 2.0 standard drinks    Types: 2 Glasses of wine per week  . Drug use: No  . Sexual activity: Not on file  Other Topics Concern  . Not on file  Social History Narrative  . Not on file   Social Determinants of Health   Financial Resource Strain:   . Difficulty of Paying Living Expenses: Not on file  Food Insecurity:   . Worried About Programme researcher, broadcasting/film/video in the Last Year: Not on file  . Ran Out of Food in the Last Year: Not on file  Transportation Needs:   . Lack of Transportation (Medical): Not on file  . Lack of Transportation (Non-Medical): Not on file  Physical Activity:   . Days of Exercise per Week: Not on file  . Minutes of Exercise per Session: Not on file  Stress:   . Feeling of Stress : Not on file  Social Connections:   . Frequency of Communication with Friends and Family: Not on file  . Frequency of Social Gatherings with Friends and Family: Not on file  . Attends Religious Services: Not on file  . Active Member of Clubs or Organizations: Not on file  . Attends Banker Meetings: Not on file  . Marital Status: Not on file  Intimate Partner Violence:   . Fear of Current or Ex-Partner: Not on file  . Emotionally Abused: Not on file  . Physically Abused: Not on file  . Sexually Abused: Not on file    Environmental and Social history  Lives in a house with a dry environment, dog located inside the household, carpet in the bedroom, no plastic on the bed, no plastic on the pillow, no smoking on going with inside the household.  He is a Psychologist, occupational.  Objective:   Vitals:   05/10/19 0937  BP: 120/90  Pulse: 96  Resp: 16  Temp: (!) 97.3 F (36.3 C)  SpO2: 97%   Height: 5' 7.4" (171.2 cm) Weight: 215 lb 12.8 oz (97.9 kg)  Physical Exam Constitutional:      Appearance: He is not diaphoretic.  HENT:     Head: Normocephalic.     Right Ear:  Tympanic membrane, ear canal and external ear normal.     Left Ear: Tympanic membrane, ear canal and external ear normal.     Nose: Nose normal. No mucosal edema or rhinorrhea.     Mouth/Throat:     Pharynx: Uvula midline. No oropharyngeal exudate.  Eyes:     Conjunctiva/sclera: Conjunctivae normal.  Neck:     Thyroid: No thyromegaly.     Trachea: Trachea normal. No tracheal tenderness or tracheal deviation.  Cardiovascular:     Rate and Rhythm: Normal rate and regular rhythm.  Heart sounds: Normal heart sounds, S1 normal and S2 normal. No murmur.  Pulmonary:     Effort: No respiratory distress.     Breath sounds: Normal breath sounds. No stridor. No wheezing or rales.  Lymphadenopathy:     Head:     Right side of head: No tonsillar adenopathy.     Left side of head: No tonsillar adenopathy.     Cervical: No cervical adenopathy.  Skin:    Findings: No erythema or rash.     Nails: There is no clubbing.  Neurological:     Mental Status: He is alert.     Diagnostics: Allergy skin tests were performed.  He demonstrated hypersensitivity to house dust mite, grasses, and cat  Spirometry was performed and demonstrated an FEV1 of 2.91 @ 76 % of predicted. FEV1/FVC = 0.86  Results of blood tests obtained 04 May 2019 identifies creatinine 1.52 mg/DL, glucose 151 mg/DL, AST 30 1U/L, ALT 30 4U/L, WBC 10.0, hemoglobin 15.1, platelet 294  Results of blood tests obtained 30 April 2019 identifies WBC 10.6, absolute eosinophil 200, absolute lymphocyte 3900, hemoglobin 15.9, platelet 203.   Assessment and Plan:    1. Anaphylaxis, subsequent encounter   2. Perennial allergic rhinitis   3. Seasonal allergic rhinitis due to pollen   4. Asthma, mild intermittent, well-controlled   5. Lymphocytosis   6. Hyperglycemia     1.  Allergen avoidance measures - dust mite, pollen, cat  2.  Continue nasal steroid with Nasonex, OTC Nasacort, OTC Rhinocort  3. If Needed:   A. OTC  antihistamine  B. Albuterol HFA - 2 inhalations every 4-6 hours  C. Epinephrine, benadryl, MD/ER evaluation for allergic reaction  4. Blood - CBC w/D, Alpha-gal panel, Tryptase, hemoglobin A1C  5. Further evaluation? Yes, if recurrent  6. Obtain Covid vaccine  7. Contact clinic if reactions  Sean Crosby has an overactive immune system with immunological hyperreactivity that appears to be giving rise to inflammation of his airway and is well recurrent allergic reactions.  We will investigate this immunological hyperreactivity more detail with the blood test noted above and as well given the fact that he did demonstrate hyperglycemia on a previous blood test we will check a hemoglobin A1c.  We will get him to avoid specific aeroallergens as noted above and we will see how things go with this approach as he progresses through this upcoming springtime season.  I will contact him with the results of his blood test once they are available for review.  Sean Prows, MD Allergy / Immunology [facility] [place]

## 2019-05-11 ENCOUNTER — Encounter: Payer: Self-pay | Admitting: Allergy and Immunology

## 2019-05-12 ENCOUNTER — Encounter: Payer: Self-pay | Admitting: *Deleted

## 2019-05-17 ENCOUNTER — Other Ambulatory Visit: Payer: Self-pay

## 2019-05-17 DIAGNOSIS — D72820 Lymphocytosis (symptomatic): Secondary | ICD-10-CM

## 2019-05-17 LAB — CBC WITH DIFFERENTIAL/PLATELET
Basophils Absolute: 0 10*3/uL (ref 0.0–0.2)
Basos: 0 %
EOS (ABSOLUTE): 0.1 10*3/uL (ref 0.0–0.4)
Eos: 0 %
Hematocrit: 48.5 % (ref 37.5–51.0)
Hemoglobin: 16.5 g/dL (ref 13.0–17.7)
Immature Grans (Abs): 0.1 10*3/uL (ref 0.0–0.1)
Immature Granulocytes: 1 %
Lymphocytes Absolute: 4.6 10*3/uL — ABNORMAL HIGH (ref 0.7–3.1)
Lymphs: 36 %
MCH: 29.8 pg (ref 26.6–33.0)
MCHC: 34 g/dL (ref 31.5–35.7)
MCV: 88 fL (ref 79–97)
Monocytes Absolute: 0.7 10*3/uL (ref 0.1–0.9)
Monocytes: 6 %
Neutrophils Absolute: 7.2 10*3/uL — ABNORMAL HIGH (ref 1.4–7.0)
Neutrophils: 57 %
Platelets: 273 10*3/uL (ref 150–450)
RBC: 5.54 x10E6/uL (ref 4.14–5.80)
RDW: 12.4 % (ref 11.6–15.4)
WBC: 12.8 10*3/uL — ABNORMAL HIGH (ref 3.4–10.8)

## 2019-05-17 LAB — ALPHA-GAL PANEL
Alpha Gal IgE*: <0.1 kU/L (ref <0.10)
Beef (Bos spp) IgE: <0.1 kU/L (ref <0.35)
Class Interpretation: 0
Class Interpretation: 0
Class Interpretation: 0
Lamb/Mutton (Ovis spp) IgE: <0.1 kU/L (ref <0.35)
Pork (Sus spp) IgE: <0.1 kU/L (ref <0.35)

## 2019-05-17 LAB — HEMOGLOBIN A1C
Est. average glucose Bld gHb Est-mCnc: 120 mg/dL
Hgb A1c MFr Bld: 5.8 % — ABNORMAL HIGH (ref 4.8–5.6)

## 2019-05-17 LAB — TRYPTASE: Tryptase: 4.7 ug/L (ref 2.2–13.2)

## 2019-05-22 ENCOUNTER — Telehealth: Payer: Self-pay | Admitting: Allergy and Immunology

## 2019-05-22 DIAGNOSIS — D72820 Lymphocytosis (symptomatic): Secondary | ICD-10-CM | POA: Diagnosis not present

## 2019-05-22 NOTE — Telephone Encounter (Signed)
Sean Crosby came in to the office requesting to pay his bill.  When I pulled his account up, it stated he had a zero balance.  I told him as of right now he didn't owe anything and that it takes time for insurance to come through.  He then asked me if the insurance paid everything.  I told him I wasn't sure if the balance was after the insurance paid or not.  So he requested someone in billing to please call him.

## 2019-05-22 NOTE — Telephone Encounter (Signed)
Insurance has not paid yet. It usually take 30-45 days. Will you please call him and let him know?

## 2019-05-25 LAB — COMP PANEL: LEUKEMIA/LYMPHOMA

## 2019-06-09 ENCOUNTER — Encounter: Payer: Self-pay | Admitting: Allergy and Immunology

## 2019-07-11 DIAGNOSIS — Z6831 Body mass index (BMI) 31.0-31.9, adult: Secondary | ICD-10-CM | POA: Diagnosis not present

## 2019-07-11 DIAGNOSIS — M67479 Ganglion, unspecified ankle and foot: Secondary | ICD-10-CM | POA: Diagnosis not present

## 2019-07-11 DIAGNOSIS — Z1331 Encounter for screening for depression: Secondary | ICD-10-CM | POA: Diagnosis not present

## 2019-07-11 DIAGNOSIS — I10 Essential (primary) hypertension: Secondary | ICD-10-CM | POA: Diagnosis not present

## 2019-07-27 DIAGNOSIS — J0100 Acute maxillary sinusitis, unspecified: Secondary | ICD-10-CM | POA: Diagnosis not present

## 2019-12-01 DIAGNOSIS — Z0000 Encounter for general adult medical examination without abnormal findings: Secondary | ICD-10-CM | POA: Diagnosis not present

## 2019-12-01 DIAGNOSIS — Z13220 Encounter for screening for lipoid disorders: Secondary | ICD-10-CM | POA: Diagnosis not present

## 2019-12-01 DIAGNOSIS — Z6830 Body mass index (BMI) 30.0-30.9, adult: Secondary | ICD-10-CM | POA: Diagnosis not present

## 2020-02-03 DIAGNOSIS — R051 Acute cough: Secondary | ICD-10-CM | POA: Diagnosis not present

## 2020-02-03 DIAGNOSIS — J45909 Unspecified asthma, uncomplicated: Secondary | ICD-10-CM | POA: Diagnosis not present

## 2020-02-03 DIAGNOSIS — R509 Fever, unspecified: Secondary | ICD-10-CM | POA: Diagnosis not present

## 2020-03-14 DIAGNOSIS — J069 Acute upper respiratory infection, unspecified: Secondary | ICD-10-CM | POA: Diagnosis not present

## 2020-03-14 DIAGNOSIS — R051 Acute cough: Secondary | ICD-10-CM | POA: Diagnosis not present

## 2020-04-05 ENCOUNTER — Telehealth: Payer: Self-pay | Admitting: Allergy and Immunology

## 2020-04-05 NOTE — Telephone Encounter (Signed)
Sean Crosby walked in and asked if Dr. Lucie Leather had ever had a RAST test done.  I looked in the chart and did not see one.  He states a nurse friend suggested he have a RAST test done.  Sean Crosby would like Dr. Lucie Leather to give him a call to discuss this as he would not discuss this with me.  Please advise.

## 2020-06-03 DIAGNOSIS — J209 Acute bronchitis, unspecified: Secondary | ICD-10-CM | POA: Diagnosis not present

## 2020-06-03 DIAGNOSIS — J019 Acute sinusitis, unspecified: Secondary | ICD-10-CM | POA: Diagnosis not present

## 2020-09-07 DIAGNOSIS — J019 Acute sinusitis, unspecified: Secondary | ICD-10-CM | POA: Diagnosis not present

## 2020-10-17 DIAGNOSIS — K219 Gastro-esophageal reflux disease without esophagitis: Secondary | ICD-10-CM | POA: Diagnosis not present

## 2020-10-17 DIAGNOSIS — I10 Essential (primary) hypertension: Secondary | ICD-10-CM | POA: Diagnosis not present

## 2020-10-17 DIAGNOSIS — J309 Allergic rhinitis, unspecified: Secondary | ICD-10-CM | POA: Diagnosis not present

## 2020-10-17 DIAGNOSIS — R5382 Chronic fatigue, unspecified: Secondary | ICD-10-CM | POA: Diagnosis not present

## 2020-10-17 DIAGNOSIS — J45909 Unspecified asthma, uncomplicated: Secondary | ICD-10-CM | POA: Diagnosis not present

## 2020-12-23 IMAGING — CT CT RENAL STONE PROTOCOL
2 of 4 series · 16 of 46 positions shown, 18 images · non-contrast
Comparison: CT abdomen pelvis dated 05/01/2019.

CLINICAL DATA: 38-year-old male with flank pain. Kidney stone
suspected.

EXAM:
CT ABDOMEN AND PELVIS WITHOUT CONTRAST
TECHNIQUE: Multidetector CT imaging of the abdomen and pelvis was performed
following the standard protocol without IV contrast.

[Series 2: stone full standard · axial · 0.87mm/px · z∈[-995,-530]mm · 13 of 103 slices shown, 15 images]
[im 5/103  soft-tissue]
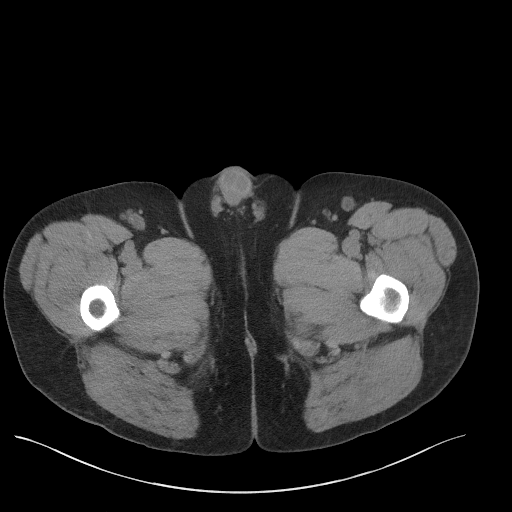
[im 5/103  bone]
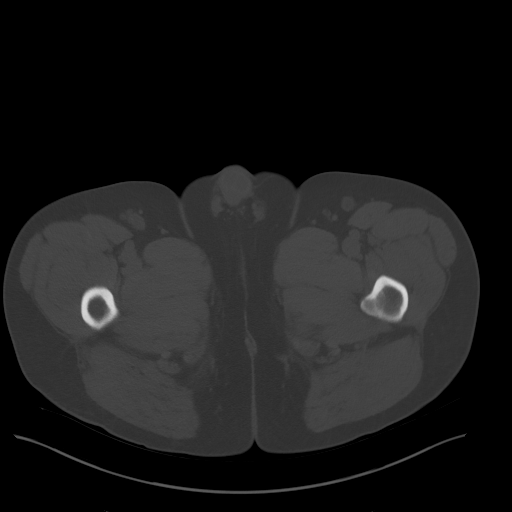
[im 13/103  soft-tissue]
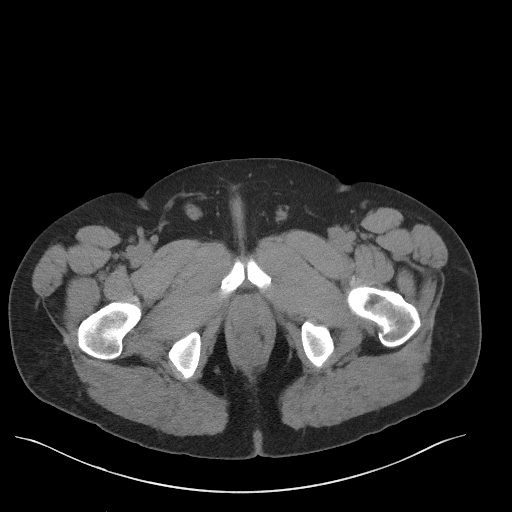
[im 22/103  soft-tissue]
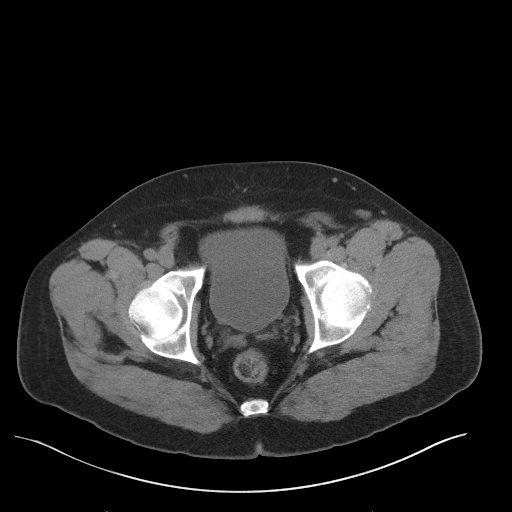
[im 30/103  soft-tissue]
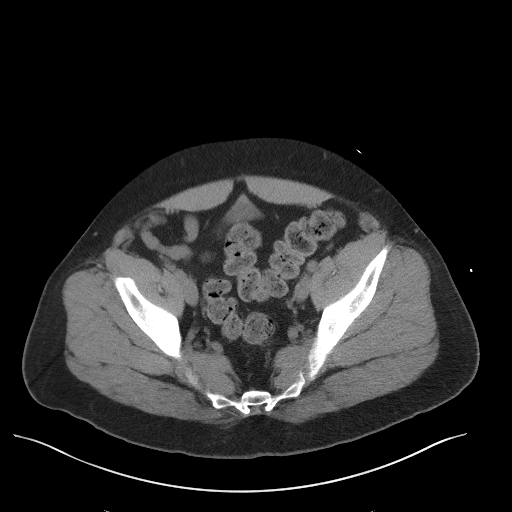
[im 35/103  soft-tissue]
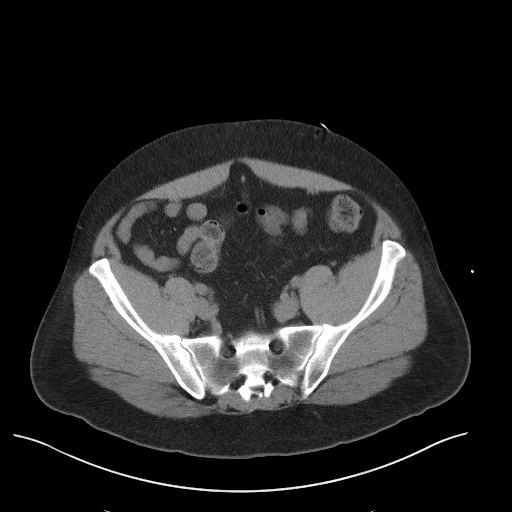
[im 43/103  soft-tissue]
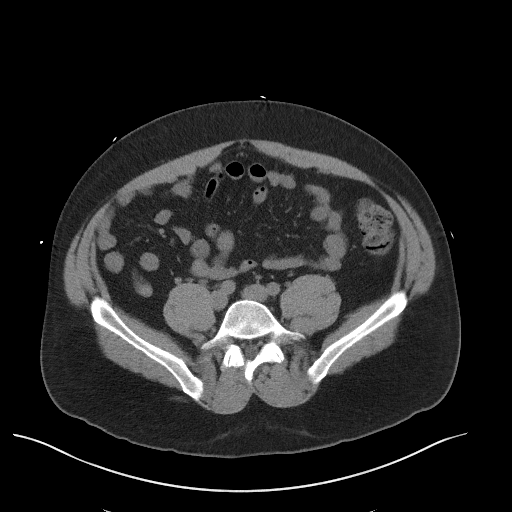
[im 52/103  soft-tissue]
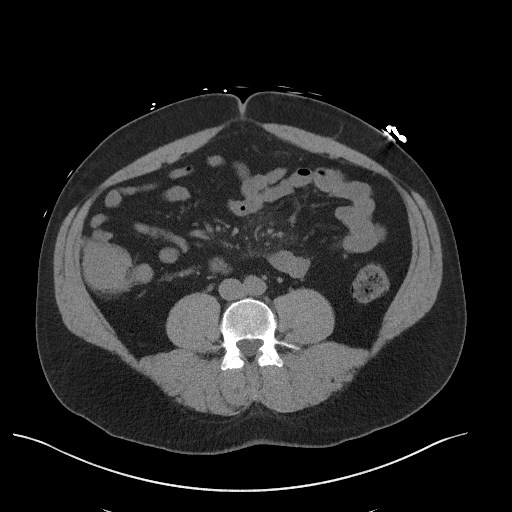
[im 60/103  soft-tissue]
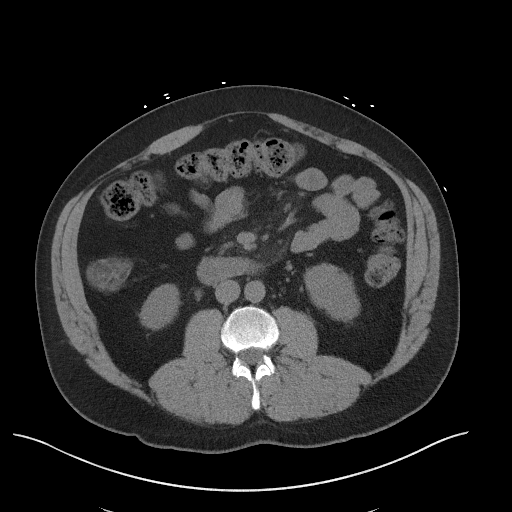
[im 69/103  soft-tissue]
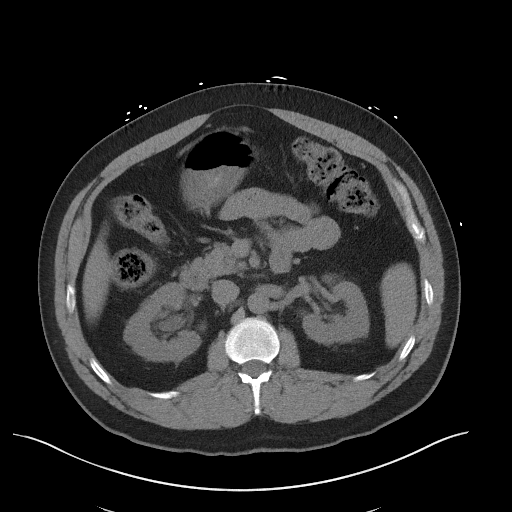
[im 69/103  bone]
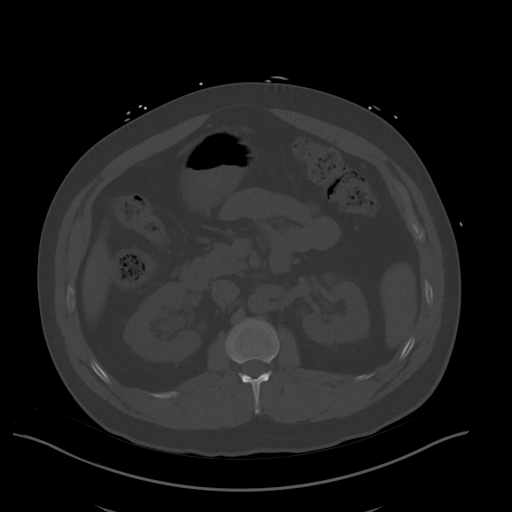
[im 73/103  soft-tissue]
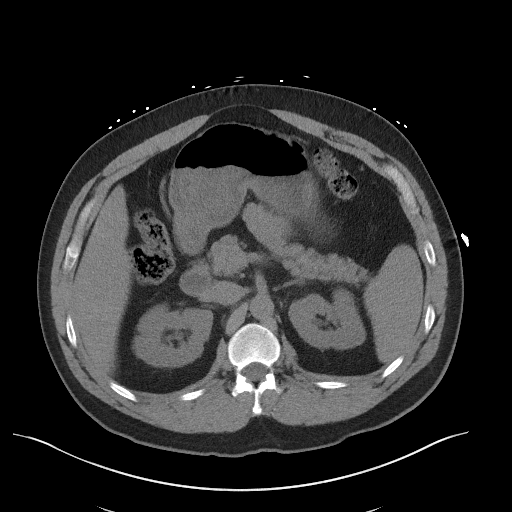
[im 81/103  soft-tissue]
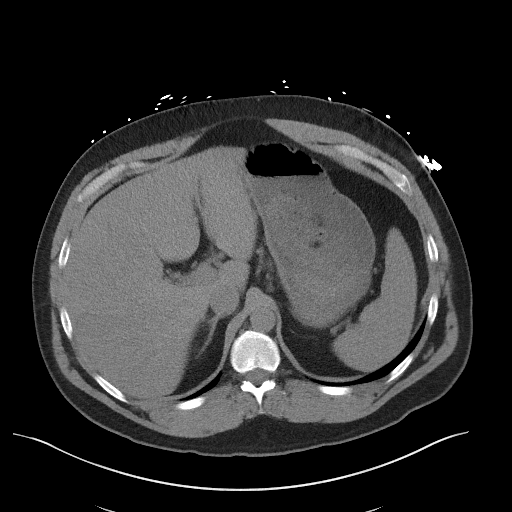
[im 90/103  soft-tissue]
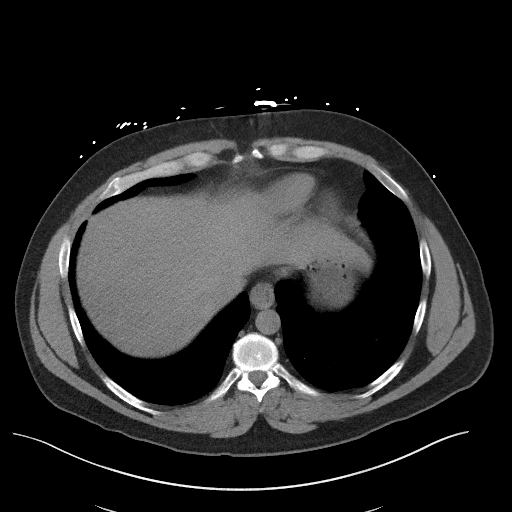
[im 98/103  soft-tissue]
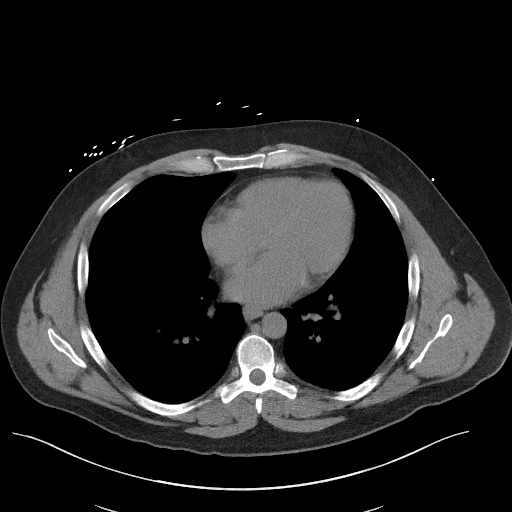

[Series 5: coronal · coronal · 0.90mm/px · 3 of 155 slices shown]
[im 52/155  soft-tissue]
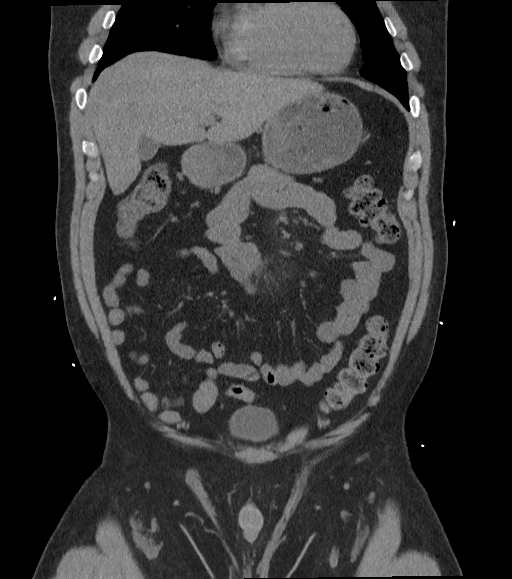
[im 69/155  soft-tissue]
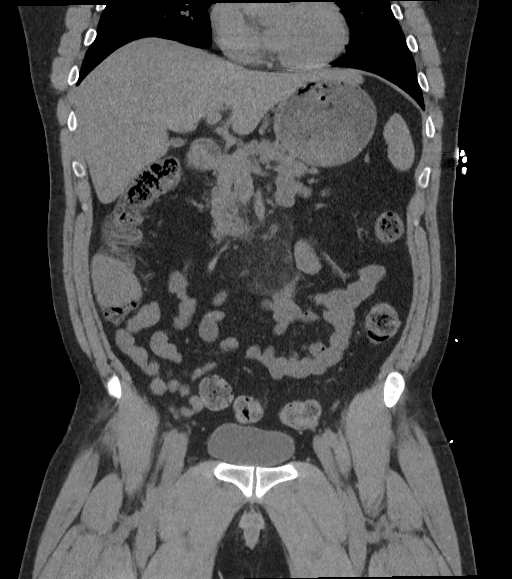
[im 86/155  soft-tissue]
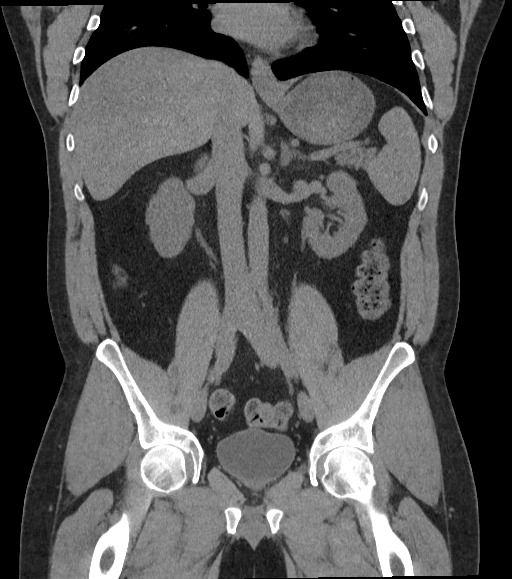

[16 of 46 positions shown; findings below may reference images not displayed]

FINDINGS: Evaluation of this exam is limited in the absence of intravenous
contrast.

Lower chest: The visualized lung bases are clear.

No intra-abdominal free air or free fluid.

Hepatobiliary: Diffuse fatty infiltration of the liver. No
intrahepatic biliary ductal dilatation. The gallbladder is
unremarkable.

Pancreas: Unremarkable. No pancreatic ductal dilatation or
surrounding inflammatory changes.

Spleen: Normal in size without focal abnormality.

Adrenals/Urinary Tract: The adrenal glands are unremarkable.
Interval distal migration of the previously seen 4 mm proximal right
ureteral calculus, now located in the distal right ureter
approximately 15 mm to the bladder. There is mild right
hydronephrosis similar to prior CT. Additional punctate
nonobstructing right renal inferior pole calculus is also noted. The
left kidney is unremarkable. The left ureter and urinary bladder
appear unremarkable as well.

Stomach/Bowel: There is moderate stool throughout the colon. There
is no bowel obstruction or active inflammation. The appendix is
normal.

Vascular/Lymphatic: The abdominal aorta and IVC are unremarkable on
this noncontrast CT. No portal venous gas. There is no adenopathy.
There is mild haziness of the mesentery with a "misty mesentery"
appearance. This finding is nonspecific but may be related to
underlying inflammatory/infectious etiology.

Reproductive: The prostate and seminal vesicles are grossly
unremarkable.

Other: None

Musculoskeletal: No acute or significant osseous findings.
IMPRESSION: 1. Interval migration of the previously seen right ureteral calculus
into the distal ureter. Persistent mild right hydronephrosis.
2. Fatty liver.

## 2021-01-17 DIAGNOSIS — Z0000 Encounter for general adult medical examination without abnormal findings: Secondary | ICD-10-CM | POA: Diagnosis not present

## 2021-01-17 DIAGNOSIS — Z6832 Body mass index (BMI) 32.0-32.9, adult: Secondary | ICD-10-CM | POA: Diagnosis not present

## 2021-02-06 DIAGNOSIS — R7989 Other specified abnormal findings of blood chemistry: Secondary | ICD-10-CM | POA: Diagnosis not present

## 2021-02-06 DIAGNOSIS — I10 Essential (primary) hypertension: Secondary | ICD-10-CM | POA: Diagnosis not present

## 2021-02-06 DIAGNOSIS — E291 Testicular hypofunction: Secondary | ICD-10-CM | POA: Diagnosis not present

## 2021-02-06 DIAGNOSIS — R739 Hyperglycemia, unspecified: Secondary | ICD-10-CM | POA: Diagnosis not present

## 2021-02-06 DIAGNOSIS — Z1331 Encounter for screening for depression: Secondary | ICD-10-CM | POA: Diagnosis not present

## 2021-04-24 DIAGNOSIS — E291 Testicular hypofunction: Secondary | ICD-10-CM | POA: Diagnosis not present

## 2021-04-24 DIAGNOSIS — R7989 Other specified abnormal findings of blood chemistry: Secondary | ICD-10-CM | POA: Diagnosis not present

## 2021-04-24 DIAGNOSIS — I10 Essential (primary) hypertension: Secondary | ICD-10-CM | POA: Diagnosis not present

## 2021-04-24 DIAGNOSIS — R739 Hyperglycemia, unspecified: Secondary | ICD-10-CM | POA: Diagnosis not present

## 2021-06-02 DIAGNOSIS — R739 Hyperglycemia, unspecified: Secondary | ICD-10-CM | POA: Diagnosis not present

## 2021-06-02 DIAGNOSIS — R7989 Other specified abnormal findings of blood chemistry: Secondary | ICD-10-CM | POA: Diagnosis not present

## 2021-06-02 DIAGNOSIS — I10 Essential (primary) hypertension: Secondary | ICD-10-CM | POA: Diagnosis not present

## 2021-06-02 DIAGNOSIS — E291 Testicular hypofunction: Secondary | ICD-10-CM | POA: Diagnosis not present

## 2021-11-17 DIAGNOSIS — J309 Allergic rhinitis, unspecified: Secondary | ICD-10-CM | POA: Diagnosis not present

## 2021-11-17 DIAGNOSIS — I10 Essential (primary) hypertension: Secondary | ICD-10-CM | POA: Diagnosis not present

## 2021-11-17 DIAGNOSIS — R5382 Chronic fatigue, unspecified: Secondary | ICD-10-CM | POA: Diagnosis not present

## 2021-11-17 DIAGNOSIS — E291 Testicular hypofunction: Secondary | ICD-10-CM | POA: Diagnosis not present

## 2021-11-17 DIAGNOSIS — R739 Hyperglycemia, unspecified: Secondary | ICD-10-CM | POA: Diagnosis not present

## 2022-01-20 DIAGNOSIS — L578 Other skin changes due to chronic exposure to nonionizing radiation: Secondary | ICD-10-CM | POA: Diagnosis not present

## 2022-01-20 DIAGNOSIS — D225 Melanocytic nevi of trunk: Secondary | ICD-10-CM | POA: Diagnosis not present

## 2022-01-20 DIAGNOSIS — L820 Inflamed seborrheic keratosis: Secondary | ICD-10-CM | POA: Diagnosis not present

## 2022-01-20 DIAGNOSIS — D485 Neoplasm of uncertain behavior of skin: Secondary | ICD-10-CM | POA: Diagnosis not present

## 2022-02-23 DIAGNOSIS — E291 Testicular hypofunction: Secondary | ICD-10-CM | POA: Diagnosis not present

## 2022-02-23 DIAGNOSIS — J45909 Unspecified asthma, uncomplicated: Secondary | ICD-10-CM | POA: Diagnosis not present

## 2022-02-23 DIAGNOSIS — R7989 Other specified abnormal findings of blood chemistry: Secondary | ICD-10-CM | POA: Diagnosis not present

## 2022-02-23 DIAGNOSIS — R739 Hyperglycemia, unspecified: Secondary | ICD-10-CM | POA: Diagnosis not present

## 2022-02-23 DIAGNOSIS — I10 Essential (primary) hypertension: Secondary | ICD-10-CM | POA: Diagnosis not present
